# Patient Record
Sex: Female | Born: 1980 | Race: Black or African American | Hispanic: No | Marital: Single | Smoking: Current every day smoker
Health system: Southern US, Community
[De-identification: ages and names within clinical notes are randomized; demographics above are authoritative.]

## PROBLEM LIST (undated history)

## (undated) DIAGNOSIS — B009 Herpesviral infection, unspecified: Secondary | ICD-10-CM

## (undated) DIAGNOSIS — N809 Endometriosis, unspecified: Secondary | ICD-10-CM

## (undated) HISTORY — PX: ABDOMINAL HYSTERECTOMY: SHX81

---

## 2006-02-19 ENCOUNTER — Inpatient Hospital Stay (HOSPITAL_COMMUNITY): Admission: AD | Admit: 2006-02-19 | Discharge: 2006-02-20 | Payer: Self-pay | Admitting: Family Medicine

## 2006-10-31 ENCOUNTER — Inpatient Hospital Stay (HOSPITAL_COMMUNITY): Admission: AD | Admit: 2006-10-31 | Discharge: 2006-10-31 | Payer: Self-pay | Admitting: Family Medicine

## 2006-11-02 ENCOUNTER — Ambulatory Visit (HOSPITAL_COMMUNITY): Admission: RE | Admit: 2006-11-02 | Discharge: 2006-11-02 | Payer: Self-pay | Admitting: Obstetrics and Gynecology

## 2006-12-16 ENCOUNTER — Ambulatory Visit (HOSPITAL_COMMUNITY): Admission: RE | Admit: 2006-12-16 | Discharge: 2006-12-16 | Payer: Self-pay | Admitting: Obstetrics and Gynecology

## 2008-09-18 ENCOUNTER — Emergency Department (HOSPITAL_BASED_OUTPATIENT_CLINIC_OR_DEPARTMENT_OTHER): Admission: EM | Admit: 2008-09-18 | Discharge: 2008-09-18 | Payer: Self-pay | Admitting: Emergency Medicine

## 2009-02-07 ENCOUNTER — Emergency Department (HOSPITAL_BASED_OUTPATIENT_CLINIC_OR_DEPARTMENT_OTHER): Admission: EM | Admit: 2009-02-07 | Discharge: 2009-02-07 | Payer: Self-pay | Admitting: Emergency Medicine

## 2009-09-01 ENCOUNTER — Emergency Department (HOSPITAL_BASED_OUTPATIENT_CLINIC_OR_DEPARTMENT_OTHER): Admission: EM | Admit: 2009-09-01 | Discharge: 2009-09-01 | Payer: Self-pay | Admitting: Emergency Medicine

## 2009-10-08 ENCOUNTER — Observation Stay (HOSPITAL_COMMUNITY): Admission: RE | Admit: 2009-10-08 | Discharge: 2009-10-08 | Payer: Self-pay | Admitting: Obstetrics and Gynecology

## 2010-05-13 ENCOUNTER — Ambulatory Visit (HOSPITAL_COMMUNITY)
Admission: RE | Admit: 2010-05-13 | Discharge: 2010-05-20 | Payer: Self-pay | Source: Home / Self Care | Admitting: Obstetrics and Gynecology

## 2010-06-21 ENCOUNTER — Inpatient Hospital Stay (HOSPITAL_COMMUNITY)
Admission: RE | Admit: 2010-06-21 | Discharge: 2010-06-22 | Payer: Self-pay | Source: Home / Self Care | Attending: Obstetrics and Gynecology | Admitting: Obstetrics and Gynecology

## 2010-06-21 ENCOUNTER — Encounter: Payer: Self-pay | Admitting: Obstetrics and Gynecology

## 2010-07-14 ENCOUNTER — Encounter: Payer: Self-pay | Admitting: Obstetrics and Gynecology

## 2010-08-20 NOTE — Discharge Summary (Signed)
  [name], Natalie Young            ACCOUNT NO.:  [id number]  MEDICAL RECORD NO.:  [id number]          PATIENT TYPE:  INP  LOCATION:  9318                          FACILITY:  [facility]  PHYSICIAN:  Arlyce Harman, MD     DATE OF BIRTH:  02-09-1981  DATE OF ADMISSION:  06/21/2010 DATE OF DISCHARGE:  06/22/2010                              DISCHARGE SUMMARY   REASON FOR HOSPITALIZATION:  The patient is a 30 year old female who had a VBAC at [facility] by Dr. Dion Body.  Following delivery it was noted that there was a laceration in the posterior portion of the cervix, and this was immediately repaired.  However, when the patient presented postpartum, she continued to have heavy bleeding and on exam a persistent defect in the back of the cervix was noted.  Dr. Dion Body discussed the condition with the MFM group from [facility], who recommended hysterectomy.  I discussed this with the patient and explained risks, benefits and possible  complications with the patient.  Informed consent was given for total vaginal hysterectomy. [name] was admitted to the hospital and underwent a vaginal hysterectomy. Postop course was uncomplicated and she was discharged after the first postop day in good condition.  She will be followed up in our office in 2 weeks.  Pertinent lab data none.  SIGNIFICANT FINDINGS:  None.  CONDITION UPON DISCHARGE:  The patient was in excellent condition with normal vital signs, ambulatory, and was voiding well.  INSTRUCTIONS:  The patient to follow up in the office in 2 weeks for temperature of 101 or greater and if any severe abdominal pain or problem.  Pathology report:  benign cervix with mild chronic cervicitis, benign secretory phase endometrium, adenomyosis and serosal fibrous adhesions.  MEDICATIONS:  She is to take Percocet 5 mg one to two every 4-6 hours as needed for pain and Motrin over-the-counter as needed for pain.     Arlyce Harman, MD     EG/MEDQ  D:  08/18/2010  T:  08/19/2010  Job:  [id number]  Electronically Signed by Arlyce Harman MD on 08/20/2010 01:11:55 PM

## 2010-09-02 LAB — CBC
MCH: 22.7 pg — ABNORMAL LOW (ref 26.0–34.0)
MCHC: 32.3 g/dL (ref 30.0–36.0)
MCV: 70.1 fL — ABNORMAL LOW (ref 78.0–100.0)
Platelets: 193 10*3/uL (ref 150–400)
RBC: 5.69 MIL/uL — ABNORMAL HIGH (ref 3.87–5.11)

## 2010-09-02 LAB — SURGICAL PCR SCREEN: Staphylococcus aureus: NEGATIVE

## 2010-09-03 LAB — CBC
HCT: 35.3 % — ABNORMAL LOW (ref 36.0–46.0)
Hemoglobin: 11 g/dL — ABNORMAL LOW (ref 12.0–15.0)
MCH: 24 pg — ABNORMAL LOW (ref 26.0–34.0)
RBC: 4.58 MIL/uL (ref 3.87–5.11)

## 2010-09-03 LAB — SURGICAL PCR SCREEN
MRSA, PCR: NEGATIVE
Staphylococcus aureus: NEGATIVE

## 2010-09-16 LAB — DIFFERENTIAL
Basophils Absolute: 0.2 10*3/uL — ABNORMAL HIGH (ref 0.0–0.1)
Basophils Relative: 2 % — ABNORMAL HIGH (ref 0–1)
Eosinophils Absolute: 0.1 10*3/uL (ref 0.0–0.7)
Eosinophils Relative: 1 % (ref 0–5)
Lymphocytes Relative: 23 % (ref 12–46)
Lymphs Abs: 2.5 10*3/uL (ref 0.7–4.0)
Monocytes Absolute: 0.6 10*3/uL (ref 0.1–1.0)
Monocytes Relative: 6 % (ref 3–12)
Neutro Abs: 7.5 10*3/uL (ref 1.7–7.7)
Neutrophils Relative %: 69 % (ref 43–77)

## 2010-09-16 LAB — BASIC METABOLIC PANEL
BUN: 10 mg/dL (ref 6–23)
CO2: 27 mEq/L (ref 19–32)
Calcium: 9.2 mg/dL (ref 8.4–10.5)
Chloride: 107 mEq/L (ref 96–112)
Creatinine, Ser: 0.8 mg/dL (ref 0.4–1.2)
GFR calc Af Amer: >60 mL/min (ref >60)
GFR calc non Af Amer: >60 mL/min (ref >60)
Glucose, Bld: 101 mg/dL — ABNORMAL HIGH (ref 70–99)
Potassium: 3.7 mEq/L (ref 3.5–5.1)
Sodium: 142 mEq/L (ref 135–145)

## 2010-09-16 LAB — URINALYSIS, ROUTINE W REFLEX MICROSCOPIC
Bilirubin Urine: NEGATIVE
Glucose, UA: NEGATIVE mg/dL
Ketones, ur: NEGATIVE mg/dL
Nitrite: NEGATIVE
Protein, ur: NEGATIVE mg/dL
Specific Gravity, Urine: 1.027 (ref 1.005–1.030)
Urobilinogen, UA: 0.2 mg/dL (ref 0.0–1.0)
pH: 6.5 (ref 5.0–8.0)

## 2010-09-16 LAB — HCG, QUANTITATIVE, PREGNANCY: hCG, Beta Chain, Quant, S: 84423 m[IU]/mL — ABNORMAL HIGH (ref <5)

## 2010-09-16 LAB — WET PREP, GENITAL

## 2010-09-16 LAB — URINE MICROSCOPIC-ADD ON

## 2010-09-16 LAB — PREGNANCY, URINE: Preg Test, Ur: POSITIVE

## 2010-09-16 LAB — CBC
HCT: 36.9 % (ref 36.0–46.0)
Platelets: 223 10*3/uL (ref 150–400)
RDW: 13 % (ref 11.5–15.5)

## 2010-09-16 LAB — GC/CHLAMYDIA PROBE AMP, GENITAL: Chlamydia, DNA Probe: NEGATIVE

## 2010-09-16 LAB — ABO/RH: ABO/RH(D): A POS

## 2010-10-03 LAB — WET PREP, GENITAL
Clue Cells Wet Prep HPF POC: NONE SEEN
Trich, Wet Prep: NONE SEEN

## 2010-10-03 LAB — GC/CHLAMYDIA PROBE AMP, GENITAL: Chlamydia, DNA Probe: NEGATIVE

## 2010-10-03 LAB — URINALYSIS, ROUTINE W REFLEX MICROSCOPIC
Bilirubin Urine: NEGATIVE
Hgb urine dipstick: NEGATIVE
Specific Gravity, Urine: 1.023 (ref 1.005–1.030)
Urobilinogen, UA: 1 mg/dL (ref 0.0–1.0)
pH: 6 (ref 5.0–8.0)

## 2010-10-03 LAB — URINE MICROSCOPIC-ADD ON

## 2011-01-23 ENCOUNTER — Ambulatory Visit (HOSPITAL_COMMUNITY): Payer: Self-pay | Admitting: Physician Assistant

## 2014-07-31 ENCOUNTER — Emergency Department (HOSPITAL_BASED_OUTPATIENT_CLINIC_OR_DEPARTMENT_OTHER)
Admission: EM | Admit: 2014-07-31 | Discharge: 2014-07-31 | Disposition: A | Discharge status: Home / Self Care | Payer: Medicaid Other | Attending: Emergency Medicine | Admitting: Emergency Medicine

## 2014-07-31 ENCOUNTER — Encounter (HOSPITAL_BASED_OUTPATIENT_CLINIC_OR_DEPARTMENT_OTHER): Payer: Self-pay | Admitting: *Deleted

## 2014-07-31 ENCOUNTER — Emergency Department (HOSPITAL_BASED_OUTPATIENT_CLINIC_OR_DEPARTMENT_OTHER): Payer: Medicaid Other

## 2014-07-31 DIAGNOSIS — Z8619 Personal history of other infectious and parasitic diseases: Secondary | ICD-10-CM | POA: Insufficient documentation

## 2014-07-31 DIAGNOSIS — R51 Headache: Secondary | ICD-10-CM | POA: Insufficient documentation

## 2014-07-31 DIAGNOSIS — N760 Acute vaginitis: Secondary | ICD-10-CM | POA: Insufficient documentation

## 2014-07-31 DIAGNOSIS — Z880 Allergy status to penicillin: Secondary | ICD-10-CM | POA: Insufficient documentation

## 2014-07-31 DIAGNOSIS — R112 Nausea with vomiting, unspecified: Secondary | ICD-10-CM | POA: Insufficient documentation

## 2014-07-31 DIAGNOSIS — H538 Other visual disturbances: Secondary | ICD-10-CM | POA: Insufficient documentation

## 2014-07-31 DIAGNOSIS — B9689 Other specified bacterial agents as the cause of diseases classified elsewhere: Secondary | ICD-10-CM

## 2014-07-31 DIAGNOSIS — L03314 Cellulitis of groin: Secondary | ICD-10-CM | POA: Insufficient documentation

## 2014-07-31 DIAGNOSIS — Z720 Tobacco use: Secondary | ICD-10-CM | POA: Insufficient documentation

## 2014-07-31 DIAGNOSIS — R519 Headache, unspecified: Secondary | ICD-10-CM

## 2014-07-31 DIAGNOSIS — Z79899 Other long term (current) drug therapy: Secondary | ICD-10-CM | POA: Insufficient documentation

## 2014-07-31 HISTORY — DX: Herpesviral infection, unspecified: B00.9

## 2014-07-31 HISTORY — DX: Endometriosis, unspecified: N80.9

## 2014-07-31 LAB — WET PREP, GENITAL
Trich, Wet Prep: NONE SEEN
YEAST WET PREP: NONE SEEN

## 2014-07-31 MED ORDER — SULFAMETHOXAZOLE-TRIMETHOPRIM 800-160 MG PO TABS: 1.0000 | ORAL_TABLET | Freq: Two times a day (BID) | ORAL | Status: DC

## 2014-07-31 MED ORDER — DIPHENHYDRAMINE HCL 50 MG/ML IJ SOLN
25.0000 mg | Freq: Once | INTRAMUSCULAR | Status: AC
  Administered 2014-07-31: 25 mg via INTRAVENOUS
  Filled 2014-07-31: qty 1

## 2014-07-31 MED ORDER — DEXAMETHASONE SODIUM PHOSPHATE 10 MG/ML IJ SOLN
10.0000 mg | Freq: Once | INTRAMUSCULAR | Status: AC
  Administered 2014-07-31: 10 mg via INTRAVENOUS
  Filled 2014-07-31: qty 1

## 2014-07-31 MED ORDER — METOCLOPRAMIDE HCL 5 MG/ML IJ SOLN
10.0000 mg | Freq: Once | INTRAMUSCULAR | Status: AC
  Administered 2014-07-31: 10 mg via INTRAVENOUS
  Filled 2014-07-31: qty 2

## 2014-07-31 MED ORDER — METRONIDAZOLE 500 MG PO TABS: 500.0000 mg | ORAL_TABLET | Freq: Two times a day (BID) | ORAL | Status: DC

## 2014-07-31 NOTE — ED Provider Notes (Signed)
CSN: [id number]     Arrival date & time 07/31/14  1441 History  This chart was scribed for Elwin Mocha, MD by Abel Presto, ED Scribe. This patient was seen in room MH08/MH08 and the patient's care was started at 3:19 PM.     Chief Complaint  Patient presents with  . Headache     Patient is a 34 y.o. female presenting with headaches. The history is provided by the patient. No language interpreter was used.  Headache Pain location:  Frontal Quality:  Dull Radiates to:  Does not radiate Duration:  16 weeks Timing:  Intermittent Progression:  Worsening Chronicity:  Recurrent Similar to prior headaches: yes   Relieved by:  NSAIDs Associated symptoms: nausea and vomiting   Associated symptoms: no fever    HPI Comments: Natalie Young is a 34 y.o. female with PMHx of endometriosis and herpes who presents to the Emergency Department complaining of intermittent frontal pressure headache with first onset 4 months ago. Pt states location of headache changes and have worsened to daily now. Pt notes associated visual disturbances, nausea, and vomiting. Pt has used Excedrin, ibuprofen, and BC powder with mild relief. Pt has never been seen in ED for headaches. Pt has not seen a neurologist or had a CT scan.  Pt has an abscess in her vaginal area. She denies associated swelling. She has never had an abscess before. Pt denies fever and chills.  Past Medical History  Diagnosis Date  . Endometriosis   . Herpes    Past Surgical History  Procedure Laterality Date  . Cesarean section    . Abdominal hysterectomy     History reviewed. No pertinent family history. History  Substance Use Topics  . Smoking status: Current Every Day Smoker -- 0.50 packs/day    Types: Cigarettes  . Smokeless tobacco: Not on file  . Alcohol Use: No   OB History    No data available     Review of Systems  Constitutional: Negative for fever and chills.  Eyes: Positive for visual disturbance (seeing spots).   Gastrointestinal: Positive for nausea and vomiting.  Skin:       abscess  Neurological: Positive for headaches.  All other systems reviewed and are negative.     Allergies  Penicillins  Home Medications   Prior to Admission medications   Medication Sig Start Date End Date Taking? Authorizing Provider  valACYclovir (VALTREX) 500 MG tablet Take 500 mg by mouth 2 (two) times daily.   Yes Historical Provider, MD   BP 122/90 mmHg  Pulse 83  Temp(Src) 99 F (37.2 C) (Oral)  Resp 18  Ht 5' 3.5" (1.613 m)  Wt 165 lb (74.844 kg)  BMI 28.77 kg/m2  SpO2 100% Physical Exam  Constitutional: She is oriented to person, place, and time. She appears well-developed and well-nourished. No distress.  HENT:  Head: Normocephalic and atraumatic.  Eyes: EOM are normal.  Neck: Normal range of motion.  Cardiovascular: Normal rate, regular rhythm and normal heart sounds.   Pulmonary/Chest: Effort normal and breath sounds normal.  Abdominal: Soft. She exhibits no distension. There is no tenderness.  Genitourinary:    Cervix exhibits discharge (white). Cervix exhibits no motion tenderness.  No evidence of Bartholin's  Musculoskeletal: Normal range of motion.  Neurological: She is alert and oriented to person, place, and time. No cranial nerve deficit or sensory deficit. She exhibits normal muscle tone. Coordination normal. GCS eye subscore is 4. GCS verbal subscore is 5. GCS motor subscore  is 6.  Skin: Skin is warm and dry. No rash noted.  Psychiatric: She has a normal mood and affect. Judgment normal.  Nursing note and vitals reviewed.   ED Course  Procedures (including critical care time) DIAGNOSTIC STUDIES: Oxygen Saturation is 100% on room air, normal by my interpretation.    COORDINATION OF CARE: 3:24 PM Discussed treatment plan with patient at beside, the patient agrees with the plan and has no further questions at this time.   Labs Review Labs Reviewed  WET PREP, GENITAL -  Abnormal; Notable for the following:    Clue Cells Wet Prep HPF POC MANY (*)    WBC, Wet Prep HPF POC MODERATE (*)    All other components within normal limits  GC/CHLAMYDIA PROBE AMP ([place])    Imaging Review Ct Head Wo Contrast  07/31/2014   CLINICAL DATA:  Frontal headaches for 4 months, recently worsening  EXAM: CT HEAD WITHOUT CONTRAST  TECHNIQUE: Contiguous axial images were obtained from the base of the skull through the vertex without intravenous contrast.  COMPARISON:  None.  FINDINGS: Skull and Sinuses:Negative for fracture or destructive process. The mastoids, middle ears, and imaged paranasal sinuses are clear.  Orbits: No acute abnormality.  Brain: No evidence of acute infarction, hemorrhage, hydrocephalus, or mass lesion/mass effect.  IMPRESSION: Normal head CT.   Electronically Signed   By: Tiburcio PeaJonathan  Watts M.D.   On: 07/31/2014 15:58     EKG Interpretation None      MDM   Final diagnoses:  Acute nonintractable headache, unspecified headache type  BV (bacterial vaginosis)  Cellulitis of groin   34 year old female here with 2 complaints, headache and vaginal cellulitis. Headache: Patient has had intermittent headaches for the past 4 months. Frontal, radiating to the back of her head. Usually has relief with over-the-counter medicines including Excedrin Migraine. Headaches continue to worsen however and are not going away. She has not seen a neurologist nor has she had neuro imaging. She is neurologically intact here. Only other systemic symptoms include nausea vomiting when the headache gets severe. No fevers or blurred vision. No numbness or tingling. We'll give headache cocktail and obtain head CT.  Also having 24 hours of a possible vaginal abscess. Right lower labia with induration without fluctuance, erythema, tenderness. Pelvic exam shows small indurated area without erythema lateral to R labia. No evidence of Bartholin's. It not a superficial abscess, feels more  like cellulitis. Will treat with antibiotics - she is getting flagyl for BV, so will add keflex to coverage for GU flora. Patient feeling better after headache cocktail. Wet prep shows many clue cells. Will give flagyl also. Stable for discharge.    I personally performed the services described in this documentation, which was scribed in my presence. The recorded information has been reviewed and is accurate.Elwin Mocha.       Natalie Slovacek, MD 07/31/14 1640

## 2014-07-31 NOTE — ED Notes (Signed)
Pt c/o h/a x 4 months and abscess to vaginal area x 24 hr

## 2014-07-31 NOTE — Discharge Instructions (Signed)
Bacterial Vaginosis Bacterial vaginosis is a vaginal infection that occurs when the normal balance of bacteria in the vagina is disrupted. It results from an overgrowth of certain bacteria. This is the most common vaginal infection in women of childbearing age. Treatment is important to prevent complications, especially in pregnant women, as it can cause a premature delivery. CAUSES  Bacterial vaginosis is caused by an increase in harmful bacteria that are normally present in smaller amounts in the vagina. Several different kinds of bacteria can cause bacterial vaginosis. However, the reason that the condition develops is not fully understood. RISK FACTORS Certain activities or behaviors can put you at an increased risk of developing bacterial vaginosis, including:  Having a new sex partner or multiple sex partners.  Douching.  Using an intrauterine device (IUD) for contraception. Women do not get bacterial vaginosis from toilet seats, bedding, swimming pools, or contact with objects around them. SIGNS AND SYMPTOMS  Some women with bacterial vaginosis have no signs or symptoms. Common symptoms include:  Grey vaginal discharge.  A fishlike odor with discharge, especially after sexual intercourse.  Itching or burning of the vagina and vulva.  Burning or pain with urination. DIAGNOSIS  Your health care provider will take a medical history and examine the vagina for signs of bacterial vaginosis. A sample of vaginal fluid may be taken. Your health care provider will look at this sample under a microscope to check for bacteria and abnormal cells. A vaginal pH test may also be done.  TREATMENT  Bacterial vaginosis may be treated with antibiotic medicines. These may be given in the form of a pill or a vaginal cream. A second round of antibiotics may be prescribed if the condition comes back after treatment.  HOME CARE INSTRUCTIONS   Only take over-the-counter or prescription medicines as  directed by your health care provider.  If antibiotic medicine was prescribed, take it as directed. Make sure you finish it even if you start to feel better.  Do not have sex until treatment is completed.  Tell all sexual partners that you have a vaginal infection. They should see their health care provider and be treated if they have problems, such as a mild rash or itching.  Practice safe sex by using condoms and only having one sex partner. SEEK MEDICAL CARE IF:   Your symptoms are not improving after 3 days of treatment.  You have increased discharge or pain.  You have a fever. MAKE SURE YOU:   Understand these instructions.  Will watch your condition.  Will get help right away if you are not doing well or get worse. FOR MORE INFORMATION  Centers for Disease Control and Prevention, Division of STD Prevention: [employer]raiser[employer]raud.fi American Sexual Health Association (ASHA): www.ashastd.org  Document Released: 06/09/2005 Document Revised: 03/30/2013 Document Reviewed: 01/19/2013 [facility] Patient Information 2015 [place], [place]. This information is not intended to replace advice given to you by your health care provider. Make sure you discuss any questions you have with your health care provider.  Cellulitis Cellulitis is an infection of the skin and the tissue beneath it. The infected area is usually red and tender. Cellulitis occurs most often in the arms and lower legs.  CAUSES  Cellulitis is caused by bacteria that enter the skin through cracks or cuts in the skin. The most common types of bacteria that cause cellulitis are staphylococci and streptococci. SIGNS AND SYMPTOMS   Redness and warmth.  Swelling.  Tenderness or pain.  Fever. DIAGNOSIS  Your health care  provider can usually determine what is wrong based on a physical exam. Blood tests may also be done. TREATMENT  Treatment usually involves taking an antibiotic medicine. HOME CARE INSTRUCTIONS   Take your  antibiotic medicine as directed by your health care provider. Finish the antibiotic even if you start to feel better.  Keep the infected arm or leg elevated to reduce swelling.  Apply a warm cloth to the affected area up to 4 times per day to relieve pain.  Take medicines only as directed by your health care provider.  Keep all follow-up visits as directed by your health care provider. SEEK MEDICAL CARE IF:   You notice red streaks coming from the infected area.  Your red area gets larger or turns dark in color.  Your bone or joint underneath the infected area becomes painful after the skin has healed.  Your infection returns in the same area or another area.  You notice a swollen bump in the infected area.  You develop new symptoms.  You have a fever. SEEK IMMEDIATE MEDICAL CARE IF:   You feel very sleepy.  You develop vomiting or diarrhea.  You have a general ill feeling (malaise) with muscle aches and pains. MAKE SURE YOU:   Understand these instructions.  Will watch your condition.  Will get help right away if you are not doing well or get worse. Document Released: 03/19/2005 Document Revised: 10/24/2013 Document Reviewed: 08/25/2011 [facility] Patient Information 2015 [place]. This information is not intended to replace advice given to you by your health care provider. Make sure you discuss any questions you have with your health care provider.  General Headache Without Cause A headache is pain or discomfort felt around the head or neck area. The specific cause of a headache may not be found. There are many causes and types of headaches. A few common ones are:  Tension headaches.  Migraine headaches.  Cluster headaches.  Chronic daily headaches. HOME CARE INSTRUCTIONS   Keep all follow-up appointments with your caregiver or any specialist referral.  Only take over-the-counter or prescription medicines for pain or discomfort as directed by your  caregiver.  Lie down in a dark, quiet room when you have a headache.  Keep a headache journal to find out what may trigger your migraine headaches. For example, write down:  What you eat and drink.  How much sleep you get.  Any change to your diet or medicines.  Try massage or other relaxation techniques.  Put ice packs or heat on the head and neck. Use these 3 to 4 times per day for 15 to 20 minutes each time, or as needed.  Limit stress.  Sit up straight, and do not tense your muscles.  Quit smoking if you smoke.  Limit alcohol use.  Decrease the amount of caffeine you drink, or stop drinking caffeine.  Eat and sleep on a regular schedule.  Get 7 to 9 hours of sleep, or as recommended by your caregiver.  Keep lights dim if bright lights bother you and make your headaches worse. SEEK MEDICAL CARE IF:   You have problems with the medicines you were prescribed.  Your medicines are not working.  You have a change from the usual headache.  You have nausea or vomiting. SEEK IMMEDIATE MEDICAL CARE IF:   Your headache becomes severe.  You have a fever.  You have a stiff neck.  You have loss of vision.  You have muscular weakness or loss of muscle control.  You start losing your balance or have trouble walking.  You feel faint or pass out.  You have severe symptoms that are different from your first symptoms. MAKE SURE YOU:   Understand these instructions.  Will watch your condition.  Will get help right away if you are not doing well or get worse. Document Released: 06/09/2005 Document Revised: 09/01/2011 Document Reviewed: 06/25/2011 [facility] Patient Information 2015 [place], [place]. This information is not intended to replace advice given to you by your health care provider. Make sure you discuss any questions you have with your health care provider.

## 2014-08-01 LAB — GC/CHLAMYDIA PROBE AMP (~~LOC~~) NOT AT ARMC
Chlamydia: NEGATIVE
NEISSERIA GONORRHEA: NEGATIVE

## 2014-09-15 ENCOUNTER — Emergency Department (HOSPITAL_BASED_OUTPATIENT_CLINIC_OR_DEPARTMENT_OTHER)
Admission: EM | Admit: 2014-09-15 | Discharge: 2014-09-15 | Disposition: A | Discharge status: Home / Self Care | Payer: Medicaid Other | Attending: Emergency Medicine | Admitting: Emergency Medicine

## 2014-09-15 ENCOUNTER — Encounter (HOSPITAL_BASED_OUTPATIENT_CLINIC_OR_DEPARTMENT_OTHER): Payer: Self-pay | Admitting: *Deleted

## 2014-09-15 DIAGNOSIS — R5383 Other fatigue: Secondary | ICD-10-CM | POA: Insufficient documentation

## 2014-09-15 DIAGNOSIS — R1110 Vomiting, unspecified: Secondary | ICD-10-CM | POA: Insufficient documentation

## 2014-09-15 DIAGNOSIS — Z8619 Personal history of other infectious and parasitic diseases: Secondary | ICD-10-CM | POA: Insufficient documentation

## 2014-09-15 DIAGNOSIS — M791 Myalgia: Secondary | ICD-10-CM | POA: Insufficient documentation

## 2014-09-15 DIAGNOSIS — Z880 Allergy status to penicillin: Secondary | ICD-10-CM | POA: Insufficient documentation

## 2014-09-15 DIAGNOSIS — J111 Influenza due to unidentified influenza virus with other respiratory manifestations: Secondary | ICD-10-CM

## 2014-09-15 DIAGNOSIS — Z79899 Other long term (current) drug therapy: Secondary | ICD-10-CM | POA: Insufficient documentation

## 2014-09-15 DIAGNOSIS — R509 Fever, unspecified: Secondary | ICD-10-CM | POA: Insufficient documentation

## 2014-09-15 DIAGNOSIS — Z792 Long term (current) use of antibiotics: Secondary | ICD-10-CM | POA: Insufficient documentation

## 2014-09-15 DIAGNOSIS — R69 Illness, unspecified: Secondary | ICD-10-CM

## 2014-09-15 DIAGNOSIS — Z87448 Personal history of other diseases of urinary system: Secondary | ICD-10-CM | POA: Insufficient documentation

## 2014-09-15 MED ORDER — HYDROCODONE-HOMATROPINE 5-1.5 MG/5ML PO SYRP: 5.0000 mL | ORAL_SOLUTION | Freq: Four times a day (QID) | ORAL | Status: DC | PRN

## 2014-09-15 NOTE — ED Provider Notes (Signed)
CSN: [id number]     Arrival date & time 09/15/14  1135 History   First MD Initiated Contact with Patient 09/15/14 1222     Chief Complaint  Patient presents with  . Emesis  . Fever     (Consider location/radiation/quality/duration/timing/severity/associated sxs/prior Treatment) HPI Comments: Patient presents with a 4 to five-day history of myalgias runny nose congestion and coughing. She's also had some intermittent fevers. She's had some posttussive emesis and some loose stools. She denies any shortness of breath. She has pain to the right side of her neck but denies any midline neck pain or stiffness. She's had some intermittent headaches but no worsening headaches. She's been using over-the-counter medicines without relief.  Patient is a 34 y.o. female presenting with vomiting and fever.  Emesis Associated symptoms: myalgias   Associated symptoms: no abdominal pain, no arthralgias, no chills, no diarrhea and no headaches   Fever Associated symptoms: congestion, cough, myalgias, rhinorrhea and vomiting   Associated symptoms: no chest pain, no chills, no diarrhea, no headaches, no nausea and no rash     Past Medical History  Diagnosis Date  . Endometriosis   . Herpes    Past Surgical History  Procedure Laterality Date  . Cesarean section    . Abdominal hysterectomy     No family history on file. History  Substance Use Topics  . Smoking status: Current Every Day Smoker -- 0.50 packs/day    Types: Cigarettes  . Smokeless tobacco: Not on file  . Alcohol Use: No   OB History    No data available     Review of Systems  Constitutional: Positive for fever and fatigue. Negative for chills and diaphoresis.  HENT: Positive for congestion, postnasal drip and rhinorrhea. Negative for sneezing.   Eyes: Negative.   Respiratory: Positive for cough. Negative for chest tightness and shortness of breath.   Cardiovascular: Negative for chest pain and leg swelling.  Gastrointestinal:  Positive for vomiting. Negative for nausea, abdominal pain, diarrhea and blood in stool.  Genitourinary: Negative for frequency, hematuria, flank pain and difficulty urinating.  Musculoskeletal: Positive for myalgias. Negative for back pain and arthralgias.  Skin: Negative for rash.  Neurological: Negative for dizziness, speech difficulty, weakness, numbness and headaches.      Allergies  Penicillins  Home Medications   Prior to Admission medications   Medication Sig Start Date End Date Taking? Authorizing Provider  valACYclovir (VALTREX) 500 MG tablet Take 500 mg by mouth 2 (two) times daily.   Yes Historical Provider, MD  HYDROcodone-homatropine (HYCODAN) 5-1.5 MG/5ML syrup Take 5 mLs by mouth every 6 (six) hours as needed for cough. 09/15/14   Rolan Bucco, MD  metroNIDAZOLE (FLAGYL) 500 MG tablet Take 1 tablet (500 mg total) by mouth 2 (two) times daily. 07/31/14   Elwin Mocha, MD  sulfamethoxazole-trimethoprim (SEPTRA DS) 800-160 MG per tablet Take 1 tablet by mouth every 12 (twelve) hours. 07/31/14   Elwin Mocha, MD   BP 116/83 mmHg  Pulse 86  Temp(Src) 98.7 F (37.1 C) (Oral)  Resp 20  Ht  (1.626 m)  Wt 180 lb (81.647 kg)  BMI 30.88 kg/m2  SpO2 100% Physical Exam  Constitutional: She is oriented to person, place, and time. She appears well-developed and well-nourished.  HENT:  Head: Normocephalic and atraumatic.  Mouth/Throat: Oropharynx is clear and moist.  Eyes: Conjunctivae are normal. Pupils are equal, round, and reactive to light.  Neck: Normal range of motion. Neck supple.  Cardiovascular: Normal rate, regular rhythm  and normal heart sounds.   Pulmonary/Chest: Effort normal and breath sounds normal. No respiratory distress. She has no wheezes. She has no rales. She exhibits no tenderness.  Abdominal: Soft. Bowel sounds are normal. There is no tenderness. There is no rebound and no guarding.  Musculoskeletal: Normal range of motion. She exhibits no edema.   Lymphadenopathy:    She has no cervical adenopathy.  Neurological: She is alert and oriented to person, place, and time.  Skin: Skin is warm and dry. No rash noted.  Psychiatric: She has a normal mood and affect.    ED Course  Procedures (including critical care time) Labs Review Labs Reviewed - No data to display  Imaging Review No results found.   EKG Interpretation None      MDM   Final diagnoses:  Influenza-like illness    Patient presents with influenza-like illness for the last or days. She's out of the window for Tamiflu. She is well-appearing with no active vomiting. Her vomiting seems mostly posttussive. Her vital signs are stable. She has no suggestions of meningitis or pneumonia. I offered her IV fluids but she doesn't feel that she needs them. I advised her in symptomatic care. She was given a prescription for Hycodan cough syrup. She was advised to return if she has any worsening symptoms or signs of pneumonia which were explained to the patient.    Rolan BuccoMelanie Takhia Spoon, MD 09/15/14 1318

## 2014-09-15 NOTE — ED Notes (Signed)
Pt reports body aches, fever, and n/v/d since yesterday

## 2014-09-15 NOTE — ED Notes (Signed)
Pt states she is taking theraflu, vitamin c and drinking powerade with no relief. Pt states her temp last night was 100.7

## 2015-03-09 ENCOUNTER — Emergency Department (HOSPITAL_BASED_OUTPATIENT_CLINIC_OR_DEPARTMENT_OTHER)
Admission: EM | Admit: 2015-03-09 | Discharge: 2015-03-09 | Disposition: A | Discharge status: Home / Self Care | Payer: No Typology Code available for payment source | Attending: Emergency Medicine | Admitting: Emergency Medicine

## 2015-03-09 ENCOUNTER — Encounter (HOSPITAL_BASED_OUTPATIENT_CLINIC_OR_DEPARTMENT_OTHER): Payer: Self-pay | Admitting: *Deleted

## 2015-03-09 DIAGNOSIS — Z720 Tobacco use: Secondary | ICD-10-CM | POA: Insufficient documentation

## 2015-03-09 DIAGNOSIS — Y9389 Activity, other specified: Secondary | ICD-10-CM | POA: Diagnosis not present

## 2015-03-09 DIAGNOSIS — Y92410 Unspecified street and highway as the place of occurrence of the external cause: Secondary | ICD-10-CM | POA: Diagnosis not present

## 2015-03-09 DIAGNOSIS — Z880 Allergy status to penicillin: Secondary | ICD-10-CM | POA: Insufficient documentation

## 2015-03-09 DIAGNOSIS — S199XXA Unspecified injury of neck, initial encounter: Secondary | ICD-10-CM | POA: Insufficient documentation

## 2015-03-09 DIAGNOSIS — S4992XA Unspecified injury of left shoulder and upper arm, initial encounter: Secondary | ICD-10-CM | POA: Diagnosis not present

## 2015-03-09 DIAGNOSIS — S3992XA Unspecified injury of lower back, initial encounter: Secondary | ICD-10-CM | POA: Insufficient documentation

## 2015-03-09 DIAGNOSIS — Y998 Other external cause status: Secondary | ICD-10-CM | POA: Diagnosis not present

## 2015-03-09 DIAGNOSIS — S8992XA Unspecified injury of left lower leg, initial encounter: Secondary | ICD-10-CM | POA: Insufficient documentation

## 2015-03-09 DIAGNOSIS — Z8742 Personal history of other diseases of the female genital tract: Secondary | ICD-10-CM | POA: Diagnosis not present

## 2015-03-09 DIAGNOSIS — Z79899 Other long term (current) drug therapy: Secondary | ICD-10-CM | POA: Diagnosis not present

## 2015-03-09 DIAGNOSIS — Z8619 Personal history of other infectious and parasitic diseases: Secondary | ICD-10-CM | POA: Diagnosis not present

## 2015-03-09 MED ORDER — METHOCARBAMOL 500 MG PO TABS: 500.0000 mg | ORAL_TABLET | Freq: Two times a day (BID) | ORAL | Status: AC

## 2015-03-09 MED ORDER — IBUPROFEN 400 MG PO TABS: 400.0000 mg | ORAL_TABLET | Freq: Four times a day (QID) | ORAL | Status: AC | PRN
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
## 2016-04-22 ENCOUNTER — Emergency Department (HOSPITAL_BASED_OUTPATIENT_CLINIC_OR_DEPARTMENT_OTHER)
Admission: EM | Admit: 2016-04-22 | Discharge: 2016-04-22 | Disposition: A | Discharge status: Home / Self Care | Payer: Medicaid Other | Attending: Physician Assistant | Admitting: Physician Assistant

## 2016-04-22 ENCOUNTER — Encounter (HOSPITAL_BASED_OUTPATIENT_CLINIC_OR_DEPARTMENT_OTHER): Payer: Self-pay | Admitting: Emergency Medicine

## 2016-04-22 DIAGNOSIS — R42 Dizziness and giddiness: Secondary | ICD-10-CM | POA: Diagnosis present

## 2016-04-22 DIAGNOSIS — Z79899 Other long term (current) drug therapy: Secondary | ICD-10-CM | POA: Diagnosis not present

## 2016-04-22 DIAGNOSIS — F17210 Nicotine dependence, cigarettes, uncomplicated: Secondary | ICD-10-CM | POA: Diagnosis not present

## 2016-04-22 DIAGNOSIS — R55 Syncope and collapse: Secondary | ICD-10-CM | POA: Insufficient documentation

## 2016-04-22 LAB — CBC WITH DIFFERENTIAL/PLATELET
BASOS ABS: 0.1 10*3/uL (ref 0.0–0.1)
Basophils Relative: 1 %
EOS ABS: 0.1 10*3/uL (ref 0.0–0.7)
Eosinophils Relative: 1 %
HCT: 36.8 % (ref 36.0–46.0)
Hemoglobin: 12.1 g/dL (ref 12.0–15.0)
LYMPHS ABS: 3.2 10*3/uL (ref 0.7–4.0)
Lymphocytes Relative: 45 %
MCH: 22.9 pg — ABNORMAL LOW (ref 26.0–34.0)
MCHC: 32.9 g/dL (ref 30.0–36.0)
MCV: 69.6 fL — ABNORMAL LOW (ref 78.0–100.0)
MONO ABS: 0.4 10*3/uL (ref 0.1–1.0)
Monocytes Relative: 6 %
NEUTROS ABS: 3.3 10*3/uL (ref 1.7–7.7)
Neutrophils Relative %: 47 %
Platelets: 256 10*3/uL (ref 150–400)
RBC: 5.29 MIL/uL — ABNORMAL HIGH (ref 3.87–5.11)
RDW: 14 % (ref 11.5–15.5)
WBC: 7.1 10*3/uL (ref 4.0–10.5)

## 2016-04-22 LAB — BASIC METABOLIC PANEL
Anion gap: 7 (ref 5–15)
BUN: 17 mg/dL (ref 6–20)
CALCIUM: 9.3 mg/dL (ref 8.9–10.3)
CO2: 24 mmol/L (ref 22–32)
CREATININE: 0.77 mg/dL (ref 0.44–1.00)
Chloride: 105 mmol/L (ref 101–111)
GFR calc Af Amer: >60 mL/min (ref >60)
GFR calc non Af Amer: >60 mL/min (ref >60)
Glucose, Bld: 131 mg/dL — ABNORMAL HIGH (ref 65–99)
Potassium: 3.8 mmol/L (ref 3.5–5.1)
SODIUM: 136 mmol/L (ref 135–145)

## 2016-04-22 LAB — CBG MONITORING, ED: Glucose-Capillary: 154 mg/dL — ABNORMAL HIGH (ref 65–99)

## 2016-04-22 NOTE — ED Triage Notes (Signed)
Patient reports that she passed out last night while getting ready to take a bath. The patient reports that she has had dizziness since last night and all day today
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
## 2016-04-22 NOTE — ED Provider Notes (Signed)
[facility] Provider Note   CSN: [id number] Arrival date & time: 04/22/16  2216   By signing my name below, I, Nelwyn SalisburyJoshua Fowler, attest that this documentation has been prepared under the direction and in the presence of non-physician practitioner, Audry Piliyler Raoul Ciano, PA-C. Electronically Signed: Nelwyn SalisburyJoshua Fowler, Scribe. 04/22/2016. 10:33 PM.  History   Chief Complaint Chief Complaint  Patient presents with  . Dizziness   HPI  HPI Comments:  Natalie Young is an otherwise healthy 35 y.o. female who presents to the Emergency Department complaining of sudden-onset singular episode of syncope occurring yesterday night. Pt states that she was getting out of the bath and was walking towards her bedroom door when she became dizzy and passed out for 15 minutes. Pt reports that she has gone to work today, but her dizziness and light-headedness has persisted. She also reports that she has had a headache and has slept all day since returning from work. Pt denies any visual changes, chest pain, SOB, or numbness. She also denies any hx of seizures or strokes. No other symptoms noted.   Past Medical History:  Diagnosis Date  . Endometriosis   . Herpes     There are no active problems to display for this patient.   Past Surgical History:  Procedure Laterality Date  . ABDOMINAL HYSTERECTOMY    . CESAREAN SECTION      OB History    No data available       Home Medications    Prior to Admission medications   Medication Sig Start Date End Date Taking? Authorizing Provider  gabapentin (NEURONTIN) 300 MG capsule Take 300 mg by mouth 3 (three) times daily.   Yes Historical Provider, MD  ibuprofen (ADVIL,MOTRIN) 400 MG tablet Take 1 tablet (400 mg total) by mouth every 6 (six) hours as needed. 03/09/15   Marily MemosJason Mesner, MD  methocarbamol (ROBAXIN) 500 MG tablet Take 1 tablet (500 mg total) by mouth 2 (two) times daily. 03/09/15   Marily MemosJason Mesner, MD  valACYclovir (VALTREX) 500 MG tablet Take  500 mg by mouth 2 (two) times daily.    Historical Provider, MD    Family History History reviewed. No pertinent family history.  Social History Social History  Substance Use Topics  . Smoking status: Current Every Day Smoker    Packs/day: 0.50    Types: Cigarettes  . Smokeless tobacco: Never Used  . Alcohol use No     Allergies   Penicillins   Review of Systems Review of Systems 10 Systems reviewed and are negative for acute change except as noted in the HPI.  Physical Exam Updated Vital Signs BP 118/77 (BP Location: Right Arm)   Pulse 80   Temp 98.5 F (36.9 C) (Oral)   Resp 20   Ht 5\' 4"  (1.626 m)   Wt 215 lb 4.8 oz (97.7 kg)   SpO2 98%   BMI 36.96 kg/m   Physical Exam  Constitutional: She is oriented to person, place, and time. Vital signs are normal. She appears well-developed and well-nourished. No distress.  HENT:  Head: Normocephalic and atraumatic.  Right Ear: Hearing normal.  Left Ear: Hearing normal.  Eyes: Conjunctivae and EOM are normal. Pupils are equal, round, and reactive to light.  Neck: Normal range of motion. Neck supple.  Cardiovascular: Normal rate, regular rhythm, normal heart sounds and intact distal pulses.   Pulmonary/Chest: Effort normal and breath sounds normal.  Abdominal: Soft. She exhibits no distension. There is no tenderness.  Musculoskeletal: Normal  range of motion.  Neurological: She is alert and oriented to person, place, and time. She has normal strength. No cranial nerve deficit or sensory deficit.  Cranial Nerves:  II: Pupils equal, round, reactive to light III,IV, VI: ptosis not present, extra-ocular motions intact bilaterally  V,VII: smile symmetric, facial light touch sensation equal VIII: hearing grossly normal bilaterally  IX,X: midline uvula rise  XI: bilateral shoulder shrug equal and strong XII: midline tongue extension  Skin: Skin is warm and dry.  Psychiatric: She has a normal mood and affect. Her speech is  normal and behavior is normal. Thought content normal.  Nursing note and vitals reviewed.  ED Treatments / Results  DIAGNOSTIC STUDIES:  Oxygen Saturation is 98% on RA, normal by my interpretation.    COORDINATION OF CARE:  10:33 PM Discussed treatment plan with pt at bedside which includes EKG and pt agreed to plan.  Labs (all labs ordered are listed, but only abnormal results are displayed) Labs Reviewed  CBG MONITORING, ED - Abnormal; Notable for the following:       Result Value   Glucose-Capillary 154 (*)    All other components within normal limits  CBC WITH DIFFERENTIAL/PLATELET  BASIC METABOLIC PANEL    EKG  EKG Interpretation None      Radiology No results found.  Procedures Procedures (including critical care time)  Medications Ordered in ED Medications - No data to display   Initial Impression / Assessment and Plan / ED Course  I have reviewed the triage vital signs and the nursing notes.  Pertinent labs & imaging results that were available during my care of the patient were reviewed by me and considered in my medical decision making (see chart for details).  Clinical Course   Final Clinical Impressions(s) / ED Diagnoses  I have reviewed and evaluated the relevant laboratory values I have interpreted the relevant EKG. I have reviewed the relevant previous healthcare records. I obtained HPI from historian.  ED Course:  Assessment: Pt is a 35yF who presents s/p syncopal episode while exiting warm bathtub. Noted standing and feeling weak and "passed out." Unsure of duration of LOC. No hx seizures or stroke. On exam, pt in NAD. Nontoxic/nonseptic appearing. VSS. Afebrile. Lungs CTA. Heart RRR. Abdomen nontender soft. CN evaluated and unremarkable. No Pain currently. No CP/SOB. No visual changes. Labs unremarkable. EKG unremarkable. Likely vasovagal response from heat and standing. Plan is to DC home with follow up to PCP. At time of discharge, Patient is in  no acute distress. Vital Signs are stable. Patient is able to ambulate. Patient able to tolerate PO.    Disposition/Plan:  DC Home Additional Verbal discharge instructions given and discussed with patient.  Pt Instructed to f/u with PCP in the next week for evaluation and treatment of symptoms. Return precautions given Pt acknowledges and agrees with plan  Supervising Physician Courteney Randall AnLyn Mackuen, MD   Final diagnoses:  Vaso vagal episode    New Prescriptions New Prescriptions   No medications on file   I personally performed the services described in this documentation, which was scribed in my presence. The recorded information has been reviewed and is accurate.     Audry Piliyler Makyla Bye, PA-C 04/22/16 2334    Courteney Randall AnLyn Mackuen, MD 04/23/16 1505

## 2016-05-26 IMAGING — CT CT HEAD W/O CM
1 series · 16 of 30 positions shown, 20 images · non-contrast
Comparison: None.

CLINICAL DATA: Frontal headaches for 4 months, recently worsening

EXAM:
CT HEAD WITHOUT CONTRAST
TECHNIQUE: Contiguous axial images were obtained from the base of the skull
through the vertex without intravenous contrast.

[Series 2: head 4.8 h37s · axial · 0.45mm/px · z∈[-209,-76]mm · 16 of 32 slices shown, 20 images]
[im 2/32  brain]
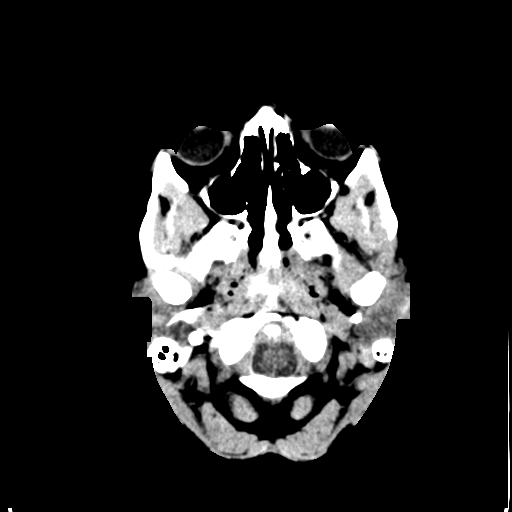
[im 2/32  bone]
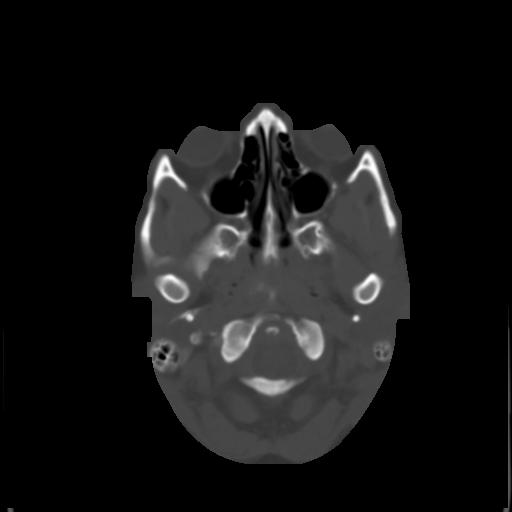
[im 4/32  brain]
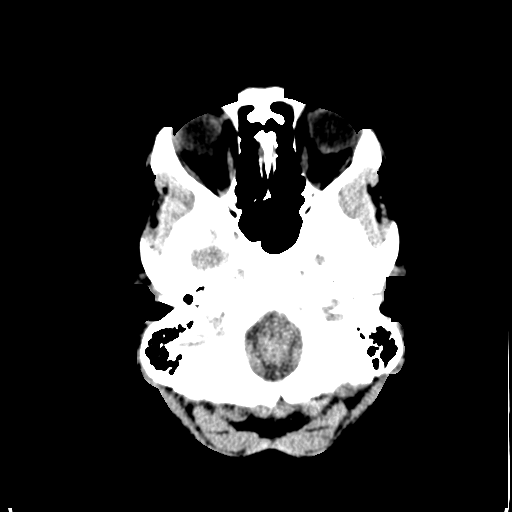
[im 6/32  brain]
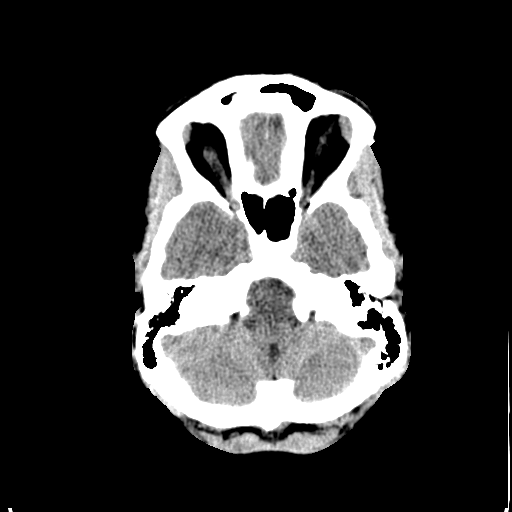
[im 8/32  brain]
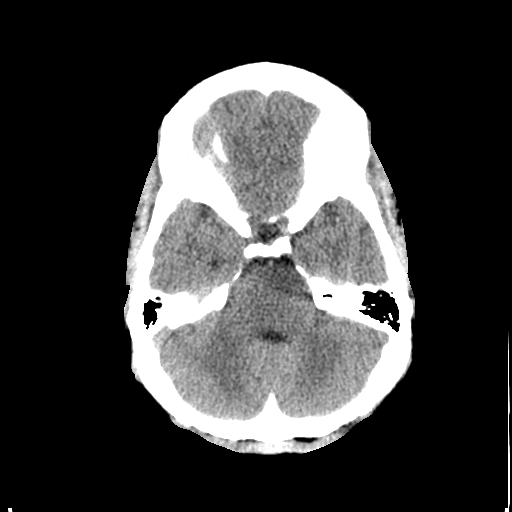
[im 9/32  brain]
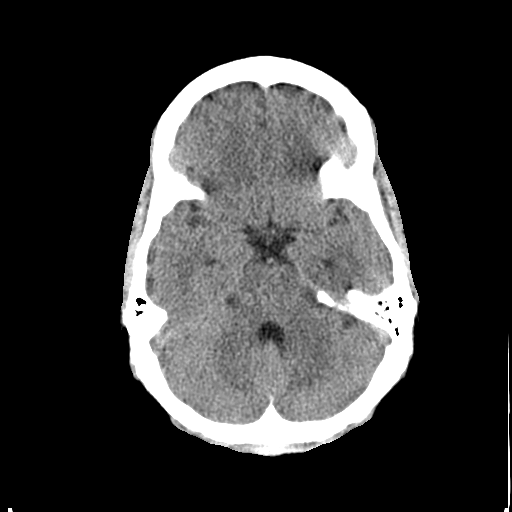
[im 9/32  bone]
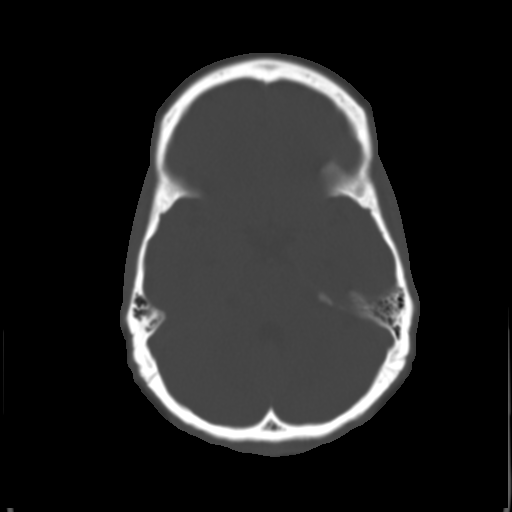
[im 11/32  brain]
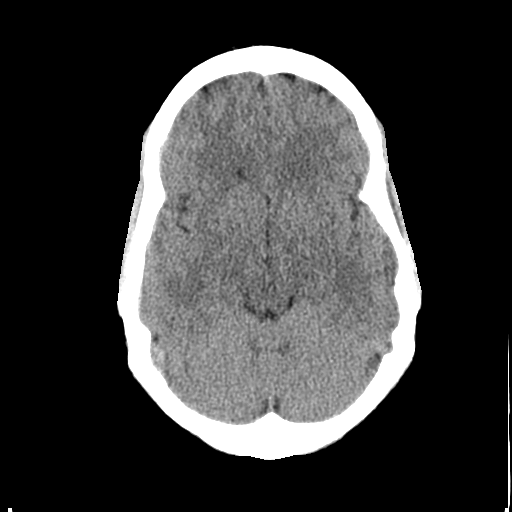
[im 13/32  brain]
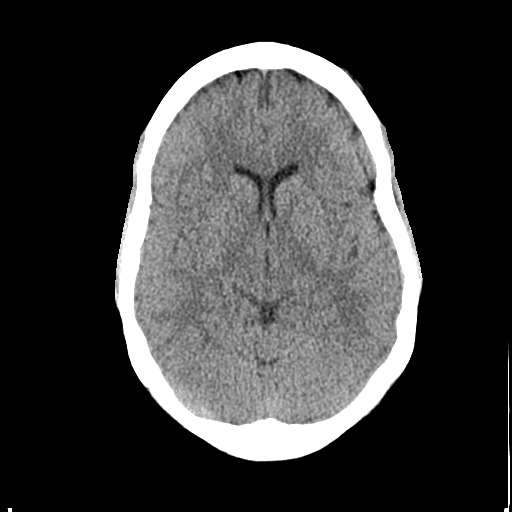
[im 15/32  brain]
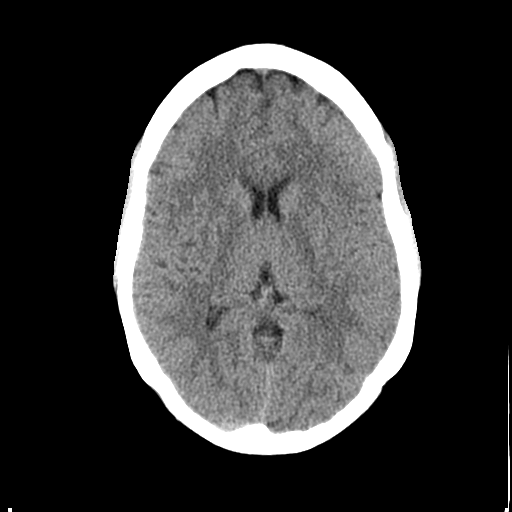
[im 17/32  brain]
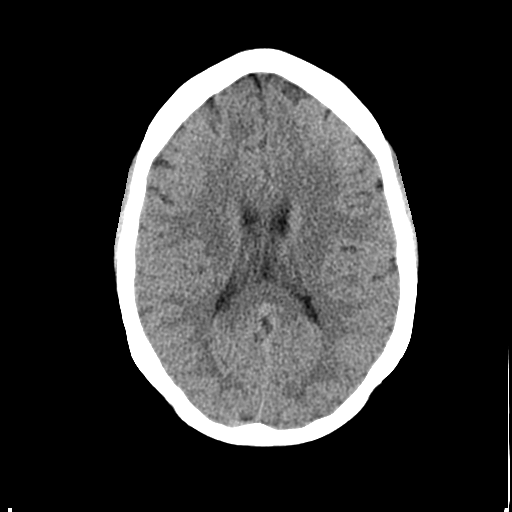
[im 17/32  bone]
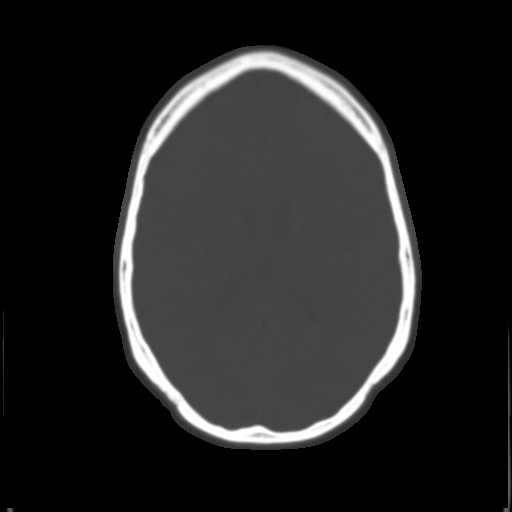
[im 19/32  brain]
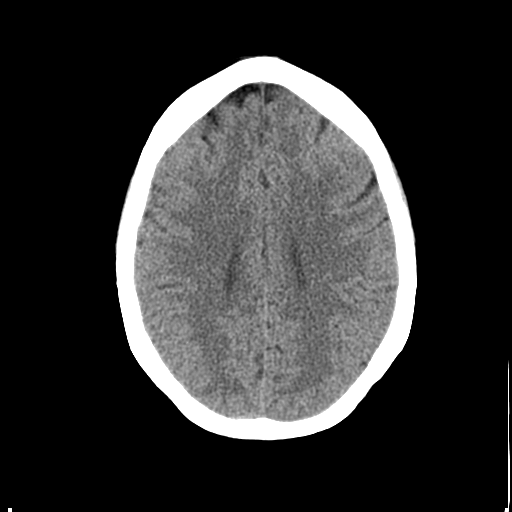
[im 21/32  brain]
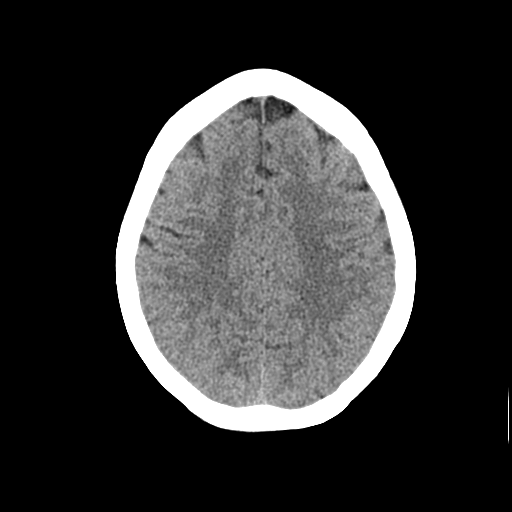
[im 23/32  brain]
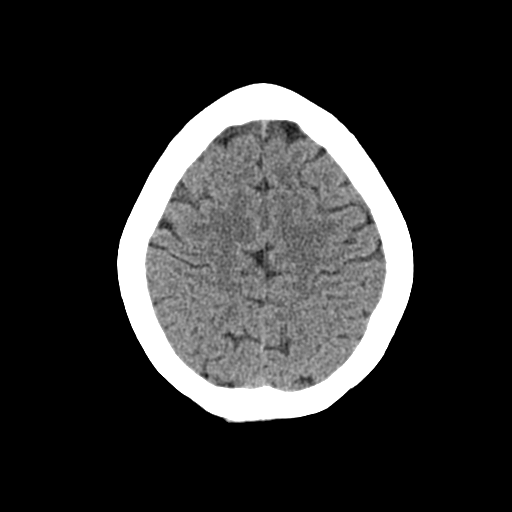
[im 24/32  brain]
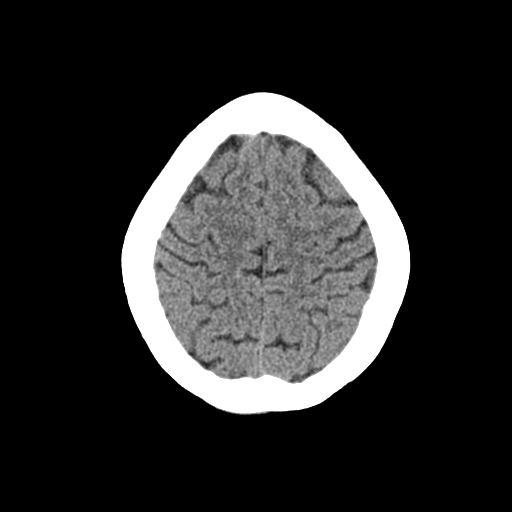
[im 24/32  bone]
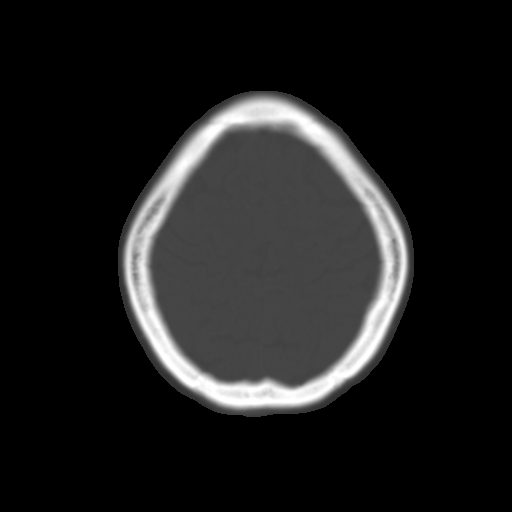
[im 26/32  brain]
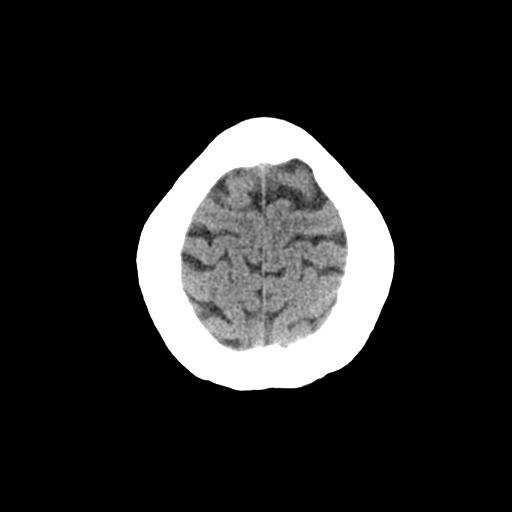
[im 28/32  brain]
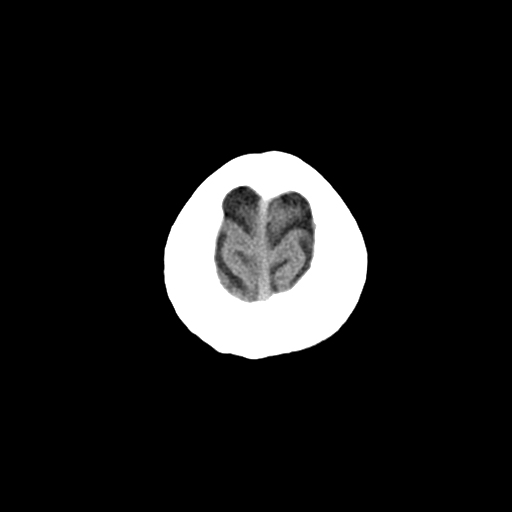
[im 30/32  brain]
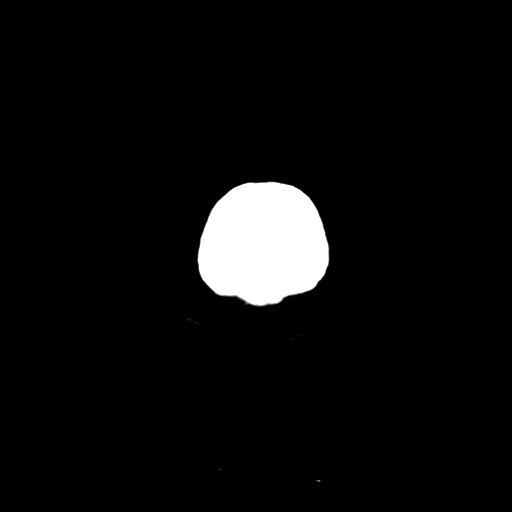

[16 of 30 positions shown; findings below may reference images not displayed]

FINDINGS: Skull and Sinuses:Negative for fracture or destructive process. The
mastoids, middle ears, and imaged paranasal sinuses are clear.

Orbits: No acute abnormality.

Brain: No evidence of acute infarction, hemorrhage, hydrocephalus,
or mass lesion/mass effect.
IMPRESSION: Normal head CT.

## 2019-07-19 ENCOUNTER — Ambulatory Visit: Payer: Medicaid Other | Attending: Internal Medicine

## 2020-07-03 ENCOUNTER — Encounter (HOSPITAL_BASED_OUTPATIENT_CLINIC_OR_DEPARTMENT_OTHER): Payer: Self-pay | Admitting: *Deleted

## 2020-07-03 ENCOUNTER — Emergency Department (HOSPITAL_BASED_OUTPATIENT_CLINIC_OR_DEPARTMENT_OTHER)
Admission: EM | Admit: 2020-07-03 | Discharge: 2020-07-03 | Disposition: A | Discharge status: Home / Self Care | Payer: Medicaid Other | Attending: Emergency Medicine | Admitting: Emergency Medicine

## 2020-07-03 ENCOUNTER — Other Ambulatory Visit: Payer: Self-pay

## 2020-07-03 DIAGNOSIS — H9201 Otalgia, right ear: Secondary | ICD-10-CM | POA: Insufficient documentation

## 2020-07-03 DIAGNOSIS — J069 Acute upper respiratory infection, unspecified: Secondary | ICD-10-CM | POA: Insufficient documentation

## 2020-07-03 DIAGNOSIS — F17210 Nicotine dependence, cigarettes, uncomplicated: Secondary | ICD-10-CM | POA: Insufficient documentation

## 2020-07-03 DIAGNOSIS — R300 Dysuria: Secondary | ICD-10-CM | POA: Insufficient documentation

## 2020-07-03 LAB — URINALYSIS, ROUTINE W REFLEX MICROSCOPIC
Bilirubin Urine: NEGATIVE
Glucose, UA: NEGATIVE mg/dL
Hgb urine dipstick: NEGATIVE
Ketones, ur: NEGATIVE mg/dL
Leukocytes,Ua: NEGATIVE
Nitrite: NEGATIVE
Protein, ur: NEGATIVE mg/dL
Specific Gravity, Urine: 1.025 (ref 1.005–1.030)
pH: 6.5 (ref 5.0–8.0)

## 2020-07-03 NOTE — ED Provider Notes (Signed)
[facility] EMERGENCY DEPARTMENT Provider Note   CSN: [id number] Arrival date & time: 07/03/20  1236     History Chief Complaint  Patient presents with  . Headache    Natalie Young is a 40 y.o. female presenting to the emergency department with complaining of headache, right ear pain, and dysuria that began yesterday.  Patient thinks she has a sinus infection or an ear infection.  She is also concerned about a UTI.  She suprapubic abdominal discomfort with dysuria and urinary frequency.  She has no pelvic pain or discharge.  She has no fevers or or sore throat.  Some runny nose.  No known COVID exposures.  Not vaccinated against COVID.  She states she was tested for COVID at the [facility] today and did not have the result back yet.  The history is provided by the patient.       Past Medical History:  Diagnosis Date  . Endometriosis   . Herpes     There are no problems to display for this patient.   Past Surgical History:  Procedure Laterality Date  . ABDOMINAL HYSTERECTOMY    . CESAREAN SECTION       OB History   No obstetric history on file.     No family history on file.  Social History   Tobacco Use  . Smoking status: Current Every Day Smoker    Packs/day: 0.50    Types: Cigarettes  . Smokeless tobacco: Never Used  Substance Use Topics  . Alcohol use: No  . Drug use: Yes    Types: Marijuana    Home Medications Prior to Admission medications   Medication Sig Start Date End Date Taking? Authorizing Provider  gabapentin (NEURONTIN) 300 MG capsule Take 300 mg by mouth 3 (three) times daily.    [provider]  ibuprofen (ADVIL,MOTRIN) 400 MG tablet Take 1 tablet (400 mg total) by mouth every 6 (six) hours as needed. 03/09/15   Mesner, Barbara Cower, MD  methocarbamol (ROBAXIN) 500 MG tablet Take 1 tablet (500 mg total) by mouth 2 (two) times daily. 03/09/15   Mesner, Barbara Cower, MD  valACYclovir (VALTREX) 500 MG tablet Take 500 mg by mouth  2 (two) times daily.    [provider]    Allergies    Penicillins  Review of Systems   Review of Systems  All other systems reviewed and are negative.   Physical Exam Updated Vital Signs BP (!) 153/84   Pulse 62   Temp 98.2 F (36.8 C) (Oral)   Resp 16   Ht 5\' 4"  (1.626 m)   Wt 88.5 kg   SpO2 100%   BMI 33.51 kg/m   Physical Exam Vitals and nursing note reviewed.  Constitutional:      General: She is not in acute distress.    Appearance: She is well-developed and well-nourished. She is not ill-appearing.  HENT:     Head: Normocephalic and atraumatic.     Right Ear: Tympanic membrane and ear canal normal.     Left Ear: Tympanic membrane and ear canal normal.     Mouth/Throat:     Pharynx: Uvula midline. Posterior oropharyngeal erythema present. No pharyngeal swelling or oropharyngeal exudate.  Eyes:     Conjunctiva/sclera: Conjunctivae normal.  Cardiovascular:     Rate and Rhythm: Normal rate and regular rhythm.  Pulmonary:     Effort: Pulmonary effort is normal. No respiratory distress.     Breath sounds: Normal breath sounds.  Abdominal:  General: Bowel sounds are normal.     Palpations: Abdomen is soft.     Tenderness: There is no abdominal tenderness. There is no guarding or rebound.  Skin:    General: Skin is warm.  Neurological:     Mental Status: She is alert.  Psychiatric:        Mood and Affect: Mood and affect normal.        Behavior: Behavior normal.     ED Results / Procedures / Treatments   Labs (all labs ordered are listed, but only abnormal results are displayed) Labs Reviewed  URINE CULTURE  URINALYSIS, ROUTINE W REFLEX MICROSCOPIC    EKG None  Radiology No results found.  Procedures Procedures (including critical care time)  Medications Ordered in ED Medications - No data to display  ED Course  I have reviewed the triage vital signs and the nursing notes.  Pertinent labs & imaging results that were available  during my care of the patient were reviewed by me and considered in my medical decision making (see chart for details).    MDM Rules/Calculators/A&P                         Natalie Young was evaluated in Emergency Department on 07/03/2020 for the symptoms described in the history of present illness. She was evaluated in the context of the global COVID-19 pandemic, which necessitated consideration that the patient might be at risk for infection with the SARS-CoV-2 virus that causes COVID-19. Institutional protocols and algorithms that pertain to the evaluation of patients at risk for COVID-19 are in a state of rapid change based on information released by regulatory bodies including the CDC and federal and state organizations. These policies and algorithms were followed during the patient's care in the ED.  Patient presenting with right ear pain, runny nose, headache, and urinary symptoms began yesterday.  No known exposures.  Not vaccinated against COVID examination is reassuring and benign.  Likely viral in nature.  Vital signs are stable, afebrile.  UA is negative for infection.  She had COVID test pending today.  Recommend she isolate at home while awaiting result.  Symptomatic management.  PCP follow-up if dysuria persists.  Patient is appropriate for discharge.  Discussed results, findings, treatment and follow up. Patient advised of return precautions. Patient verbalized understanding and agreed with plan.  Final Clinical Impression(s) / ED Diagnoses Final diagnoses:  Viral URI  Dysuria    Rx / DC Orders ED Discharge Orders    None       Antavius Sperbeck, [place] N, PA-C 07/03/20 1613    Tegeler, Canary Brim, MD 07/04/20 [phone]

## 2020-07-03 NOTE — ED Triage Notes (Signed)
Headache since yesterday. She thinks she is getting a URI.

## 2020-07-03 NOTE — Discharge Instructions (Addendum)
Your urine test is normal. You can take tylenol or ibuprofen as needed for sore throat or fever.  Drink plenty of water.  Use saline nasal spray for congestion. Follow up with your primary care provider as needed.  Return to the ER for inability to swallow liquids, difficulty breathing, or new or worsening symptoms.

## 2020-07-05 LAB — URINE CULTURE

## 2020-11-16 ENCOUNTER — Other Ambulatory Visit: Payer: Self-pay

## 2020-11-16 ENCOUNTER — Emergency Department (HOSPITAL_BASED_OUTPATIENT_CLINIC_OR_DEPARTMENT_OTHER): Payer: Self-pay

## 2020-11-16 ENCOUNTER — Encounter (HOSPITAL_BASED_OUTPATIENT_CLINIC_OR_DEPARTMENT_OTHER): Payer: Self-pay | Admitting: Emergency Medicine

## 2020-11-16 ENCOUNTER — Emergency Department (HOSPITAL_BASED_OUTPATIENT_CLINIC_OR_DEPARTMENT_OTHER)
Admission: EM | Admit: 2020-11-16 | Discharge: 2020-11-16 | Discharge status: Against Medical Advice | Payer: Self-pay | Attending: Emergency Medicine | Admitting: Emergency Medicine

## 2020-11-16 DIAGNOSIS — Z20822 Contact with and (suspected) exposure to covid-19: Secondary | ICD-10-CM | POA: Insufficient documentation

## 2020-11-16 DIAGNOSIS — G459 Transient cerebral ischemic attack, unspecified: Secondary | ICD-10-CM | POA: Diagnosis present

## 2020-11-16 DIAGNOSIS — R079 Chest pain, unspecified: Secondary | ICD-10-CM

## 2020-11-16 DIAGNOSIS — R0789 Other chest pain: Secondary | ICD-10-CM | POA: Insufficient documentation

## 2020-11-16 DIAGNOSIS — Z8673 Personal history of transient ischemic attack (TIA), and cerebral infarction without residual deficits: Secondary | ICD-10-CM | POA: Insufficient documentation

## 2020-11-16 DIAGNOSIS — F17210 Nicotine dependence, cigarettes, uncomplicated: Secondary | ICD-10-CM | POA: Insufficient documentation

## 2020-11-16 DIAGNOSIS — R200 Anesthesia of skin: Secondary | ICD-10-CM

## 2020-11-16 DIAGNOSIS — R2990 Unspecified symptoms and signs involving the nervous system: Secondary | ICD-10-CM

## 2020-11-16 DIAGNOSIS — N83202 Unspecified ovarian cyst, left side: Secondary | ICD-10-CM | POA: Insufficient documentation

## 2020-11-16 DIAGNOSIS — R202 Paresthesia of skin: Secondary | ICD-10-CM | POA: Insufficient documentation

## 2020-11-16 LAB — ETHANOL: Alcohol, Ethyl (B): 68 mg/dL — ABNORMAL HIGH (ref <10)

## 2020-11-16 LAB — COMPREHENSIVE METABOLIC PANEL
ALT: 14 U/L (ref 0–44)
AST: 21 U/L (ref 15–41)
Albumin: 3.9 g/dL (ref 3.5–5.0)
Alkaline Phosphatase: 45 U/L (ref 38–126)
Anion gap: 8 (ref 5–15)
BUN: 13 mg/dL (ref 6–20)
CO2: 21 mmol/L — ABNORMAL LOW (ref 22–32)
Calcium: 9 mg/dL (ref 8.9–10.3)
Chloride: 108 mmol/L (ref 98–111)
Creatinine, Ser: 0.68 mg/dL (ref 0.44–1.00)
GFR, Estimated: >60 mL/min (ref >60)
Glucose, Bld: 93 mg/dL (ref 70–99)
Potassium: 3.9 mmol/L (ref 3.5–5.1)
Sodium: 137 mmol/L (ref 135–145)
Total Bilirubin: 0.3 mg/dL (ref 0.3–1.2)
Total Protein: 7 g/dL (ref 6.5–8.1)

## 2020-11-16 LAB — DIFFERENTIAL
Abs Immature Granulocytes: 0.02 10*3/uL (ref 0.00–0.07)
Basophils Absolute: 0.1 10*3/uL (ref 0.0–0.1)
Basophils Relative: 1 %
Eosinophils Absolute: 0.1 10*3/uL (ref 0.0–0.5)
Eosinophils Relative: 1 %
Immature Granulocytes: 0 %
Lymphocytes Relative: 46 %
Lymphs Abs: 3.9 10*3/uL (ref 0.7–4.0)
Monocytes Absolute: 0.4 10*3/uL (ref 0.1–1.0)
Monocytes Relative: 4 %
Neutro Abs: 4 10*3/uL (ref 1.7–7.7)
Neutrophils Relative %: 48 %

## 2020-11-16 LAB — TROPONIN I (HIGH SENSITIVITY)
Troponin I (High Sensitivity): 2 ng/L (ref <18)
Troponin I (High Sensitivity): 3 ng/L (ref <18)

## 2020-11-16 LAB — PROTIME-INR
INR: 1 (ref 0.8–1.2)
Prothrombin Time: 13.1 seconds (ref 11.4–15.2)

## 2020-11-16 LAB — RAPID URINE DRUG SCREEN, HOSP PERFORMED
Amphetamines: NOT DETECTED
Barbiturates: NOT DETECTED
Benzodiazepines: NOT DETECTED
Cocaine: NOT DETECTED
Opiates: NOT DETECTED
Tetrahydrocannabinol: POSITIVE — AB

## 2020-11-16 LAB — CBC
HCT: 40 % (ref 36.0–46.0)
Hemoglobin: 12.9 g/dL (ref 12.0–15.0)
MCH: 23.5 pg — ABNORMAL LOW (ref 26.0–34.0)
MCHC: 32.3 g/dL (ref 30.0–36.0)
MCV: 72.7 fL — ABNORMAL LOW (ref 80.0–100.0)
Platelets: 267 10*3/uL (ref 150–400)
RBC: 5.5 MIL/uL — ABNORMAL HIGH (ref 3.87–5.11)
RDW: 13.8 % (ref 11.5–15.5)
WBC: 8.5 10*3/uL (ref 4.0–10.5)
nRBC: 0 % (ref 0.0–0.2)

## 2020-11-16 LAB — URINALYSIS, ROUTINE W REFLEX MICROSCOPIC
Bilirubin Urine: NEGATIVE
Glucose, UA: NEGATIVE mg/dL
Hgb urine dipstick: NEGATIVE
Ketones, ur: NEGATIVE mg/dL
Leukocytes,Ua: NEGATIVE
Nitrite: NEGATIVE
Protein, ur: NEGATIVE mg/dL
Specific Gravity, Urine: <1.005 — ABNORMAL LOW (ref 1.005–1.030)
pH: 5.5 (ref 5.0–8.0)

## 2020-11-16 LAB — RESP PANEL BY RT-PCR (FLU A&B, COVID) ARPGX2
Influenza A by PCR: NEGATIVE
Influenza B by PCR: NEGATIVE
SARS Coronavirus 2 by RT PCR: NEGATIVE

## 2020-11-16 LAB — PREGNANCY, URINE: Preg Test, Ur: NEGATIVE

## 2020-11-16 LAB — APTT: aPTT: 26 seconds (ref 24–36)

## 2020-11-16 MED ORDER — IOHEXOL 350 MG/ML SOLN
100.0000 mL | Freq: Once | INTRAVENOUS | Status: AC | PRN
  Administered 2020-11-16: 100 mL via INTRAVENOUS

## 2020-11-16 NOTE — ED Notes (Signed)
Ambulated to bathroom with steady gait.  Continues to report left arm numbness, denies pain.

## 2020-11-16 NOTE — ED Provider Notes (Signed)
[facility] EMERGENCY DEPARTMENT Provider Note   CSN: [id number] Arrival date & time: 11/16/20  1010     History Chief Complaint  Patient presents with  . left arm numbness    Rozell Kettlewell Alcon is a 40 y.o. female.  HPI     730AM left side went numb after turning to make coffee, did not turn neck EMT evaluated her at [facility] at work but then came POV to [facility] Now feeling it just in arm and chest Sharp pain going from back to chest and left sided numbness Left side was weak at first, now improved No drooping of the face, difficulty talking, change in vision No facial sensation changes   Now having pain in back to the chest, feels like stabbing pain  Fam hx of heart disease (as well as DM, htn, hldp), no fam hx of early stroke, extended fam hx of MS Smoking, etoh, thc  Past Medical History:  Diagnosis Date  . Endometriosis   . Herpes     Patient Active Problem List   Diagnosis Date Noted  . TIA (transient ischemic attack) 11/16/2020    Past Surgical History:  Procedure Laterality Date  . ABDOMINAL HYSTERECTOMY    . CESAREAN SECTION       OB History   No obstetric history on file.     History reviewed. No pertinent family history.  Social History   Tobacco Use  . Smoking status: Current Every Day Smoker    Packs/day: 0.50    Types: Cigarettes  . Smokeless tobacco: Never Used  Substance Use Topics  . Alcohol use: No  . Drug use: Yes    Types: Marijuana    Home Medications Prior to Admission medications   Medication Sig Start Date End Date Taking? Authorizing Provider  gabapentin (NEURONTIN) 300 MG capsule Take 300 mg by mouth 3 (three) times daily.    [provider]  ibuprofen (ADVIL,MOTRIN) 400 MG tablet Take 1 tablet (400 mg total) by mouth every 6 (six) hours as needed. 03/09/15   Mesner, Barbara Cower, MD  methocarbamol (ROBAXIN) 500 MG tablet Take 1 tablet (500 mg total) by mouth 2 (two) times daily. 03/09/15   Mesner, Barbara Cower,  MD  valACYclovir (VALTREX) 500 MG tablet Take 500 mg by mouth 2 (two) times daily.    [provider]    Allergies    Penicillins  Review of Systems   Review of Systems  Constitutional: Negative for fever.  HENT: Negative for sore throat.   Eyes: Negative for visual disturbance.  Respiratory: Negative for cough and shortness of breath.   Cardiovascular: Positive for chest pain.  Gastrointestinal: Negative for abdominal pain, nausea and vomiting.  Genitourinary: Negative for difficulty urinating.  Musculoskeletal: Positive for back pain. Negative for neck pain.  Skin: Negative for rash.  Neurological: Positive for weakness and numbness. Negative for dizziness, syncope, facial asymmetry, speech difficulty and headaches.    Physical Exam Updated Vital Signs BP 137/80 (BP Location: Right Arm)   Pulse 79   Temp 98.3 F (36.8 C) (Oral)   Resp 18   Ht 5\' 3"  (1.6 m)   Wt 90.7 kg   SpO2 100%   BMI 35.43 kg/m   Physical Exam Vitals and nursing note reviewed.  Constitutional:      General: She is not in acute distress.    Appearance: She is well-developed. She is not diaphoretic.  HENT:     Head: Normocephalic and atraumatic.  Eyes:  General: No visual field deficit.    Conjunctiva/sclera: Conjunctivae normal.  Cardiovascular:     Rate and Rhythm: Normal rate and regular rhythm.     Heart sounds: Normal heart sounds. No murmur heard. No friction rub. No gallop.   Pulmonary:     Effort: Pulmonary effort is normal. No respiratory distress.     Breath sounds: Normal breath sounds. No wheezing or rales.  Abdominal:     General: There is no distension.     Palpations: Abdomen is soft.     Tenderness: There is no abdominal tenderness. There is no guarding.  Musculoskeletal:        General: No tenderness.     Cervical back: Normal range of motion.  Skin:    General: Skin is warm and dry.     Findings: No erythema or rash.  Neurological:     Mental Status: She  is alert and oriented to person, place, and time.     GCS: GCS eye subscore is 4. GCS verbal subscore is 5. GCS motor subscore is 6.     Cranial Nerves: Cranial nerves are intact. No cranial nerve deficit, dysarthria or facial asymmetry.     Sensory: Sensory deficit (altered sensation LUE) present.     Motor: Motor function is intact. No weakness, tremor or pronator drift.     Coordination: Coordination is intact.     Gait: Tandem walk normal.     ED Results / Procedures / Treatments   Labs (all labs ordered are listed, but only abnormal results are displayed) Labs Reviewed  ETHANOL - Abnormal; Notable for the following components:      Result Value   Alcohol, Ethyl (B) 68 (*)    All other components within normal limits  CBC - Abnormal; Notable for the following components:   RBC 5.50 (*)    MCV 72.7 (*)    MCH 23.5 (*)    All other components within normal limits  COMPREHENSIVE METABOLIC PANEL - Abnormal; Notable for the following components:   CO2 21 (*)    All other components within normal limits  RAPID URINE DRUG SCREEN, HOSP PERFORMED - Abnormal; Notable for the following components:   Tetrahydrocannabinol POSITIVE (*)    All other components within normal limits  URINALYSIS, ROUTINE W REFLEX MICROSCOPIC - Abnormal; Notable for the following components:   Specific Gravity, Urine <1.005 (*)    All other components within normal limits  RESP PANEL BY RT-PCR (FLU A&B, COVID) ARPGX2  PROTIME-INR  APTT  DIFFERENTIAL  PREGNANCY, URINE  TROPONIN I (HIGH SENSITIVITY)  TROPONIN I (HIGH SENSITIVITY)    EKG None  Radiology CT Head Wo Contrast  Result Date: 11/16/2020 CLINICAL DATA:  Numbness on left side of body. EXAM: CT HEAD WITHOUT CONTRAST TECHNIQUE: Contiguous axial images were obtained from the base of the skull through the vertex without intravenous contrast. COMPARISON:  07/31/2014 FINDINGS: Brain: No evidence of acute infarction, hemorrhage, hydrocephalus,  extra-axial collection or mass lesion/mass effect. Vascular: No hyperdense vessel or unexpected calcification. Skull: Normal. Negative for fracture or focal lesion. Sinuses/Orbits: No acute finding. Other: None. IMPRESSION: No acute intracranial abnormalities. Normal brain.  Head Electronically Signed   By: Signa Kell M.D.   On: 11/16/2020 12:15   CT Angio Chest/Abd/Pel for Dissection W and/or Wo Contrast  Result Date: 11/16/2020 CLINICAL DATA:  Abdominal pain aortic dissection suspected. EXAM: CT ANGIOGRAPHY CHEST, ABDOMEN AND PELVIS TECHNIQUE: Non-contrast CT of the chest was initially obtained. Multidetector CT imaging through  the chest, abdomen and pelvis was performed using the standard protocol during bolus administration of intravenous contrast. Multiplanar reconstructed images and MIPs were obtained and reviewed to evaluate the vascular anatomy. CONTRAST:  OMNIPAQUE IOHEXOL 350 MG/ML SOLN COMPARISON:  None. FINDINGS: CTA CHEST FINDINGS Cardiovascular: Precontrast imaging demonstrate no evidence of intramural hematoma or calcified atherosclerosis. Preferential opacification of the thoracic aorta. No evidence of thoracic aortic aneurysm or dissection. No central pulmonary embolus. Normal heart size. No pericardial effusion. Mediastinum/Nodes: No enlarged mediastinal, hilar, or axillary lymph nodes. Thyroid gland, trachea, and esophagus demonstrate no significant findings. Lungs/Pleura: Lungs are clear. No pleural effusion or pneumothorax. Musculoskeletal: No chest wall abnormality. No acute or significant osseous findings. Review of the MIP images confirms the above findings. CTA ABDOMEN AND PELVIS FINDINGS VASCULAR Aorta: Normal caliber aorta without aneurysm, dissection, vasculitis or significant stenosis. Celiac: Patent without evidence of aneurysm, dissection, vasculitis or significant stenosis. SMA: Patent without evidence of aneurysm, dissection, vasculitis or significant stenosis. Renals:  Both renal arteries are patent without evidence of aneurysm, dissection, vasculitis, fibromuscular dysplasia or significant stenosis. IMA: Patent without evidence of aneurysm, dissection, vasculitis or significant stenosis. Inflow: Patent without evidence of aneurysm, dissection, vasculitis or significant stenosis. Veins: No obvious venous abnormality within the limitations of this arterial phase study. Review of the MIP images confirms the above findings. NON-VASCULAR Hepatobiliary: No focal liver abnormality is seen. No gallstones, gallbladder wall thickening, or biliary dilatation. Pancreas: Unremarkable. No pancreatic ductal dilatation or surrounding inflammatory changes. Spleen: Normal in size without focal abnormality. Adrenals/Urinary Tract: Adrenal glands are unremarkable. Kidneys are normal, without renal calculi, solid enhancing lesion, or hydronephrosis. Bladder is unremarkable. Stomach/Bowel: Stomach is within normal limits. Appendix appears normal. No evidence of bowel wall thickening, distention, or inflammatory changes. Colonic diverticulosis without findings of acute diverticulitis Lymphatic: No pathologically enlarged abdominal or pelvic lymph nodes. Reproductive: Uterus appears surgically absent. Right ovary is unremarkable. Within the left ovary there is thick wall crenulated structure in the left lower ovary may represent a ruptured hemorrhagic cyst or a corpus luteal cyst on image 163/5 and a thin wall low-density left ovarian cyst measuring 2.9 cm on image 156/5. No follow-up imaging recommended. Note: This recommendation does not apply to premenarchal patients and to those with increased risk (genetic, family history, elevated tumor markers or other high-risk factors) of ovarian cancer. Reference: JACR 2020 Feb; 17(2):248-254 Other: Trace pelvic free fluid. Musculoskeletal: No acute or significant osseous findings. Review of the MIP images confirms the above findings. IMPRESSION: 1. No  evidence of acute aortic syndrome. 2. No acute intrathoracic or intra-abdominal pathology. 3. Left ovarian cyst measuring 2.9 cm within an additional thick walled crenulated structure in the lower aspect of the ovary which may represent an involuted ruptured hemorrhagic cyst or a corpus luteal cyst. Additionally there is trace pelvic free fluid which may be physiologic or represent fluid from cyst rupture. Electronically Signed   By: Maudry Mayhew MD   On: 11/16/2020 12:19    Procedures Procedures   Medications Ordered in ED Medications  iohexol (OMNIPAQUE) 350 MG/ML injection 100 mL (100 mLs Intravenous Contrast Given 11/16/20 1116)    ED Course  I have reviewed the triage vital signs and the nursing notes.  Pertinent labs & imaging results that were available during my care of the patient were reviewed by me and considered in my medical decision making (see chart for details).    MDM Rules/Calculators/A&P  40 year old female with a history of smoking presents with concern for left-sided numbness, and back pain radiating to the chest.  Initially discussed calling a possible code stroke-- discussed with Dr. Selina CooleyStack of [facility] but agree that given rapidly improved and improving symptoms with only left arm numbness present on exam, do not feel the risks of tPA outweigh the benefits and code stroke was canceled.  Given her description of stabbing back pain rating to the chest in addition to neurologic symptoms, CT dissection study was ordered which showed no evidence of acute dissection.  It does show signs of a left-sided ovarian cyst and recommend follow-up ultrasound for further evaluation.  Delta troponins negative have low suspicion for ACS.  CT head was completed which shows no acute abnormalities.  Discussed presentation again with Dr. Selina CooleyStack of neurology including her transient left-sided weakness followed by left-sided numbness,  and feel admission for TIA/CVA work-up is most appropriate.  Called for admission, however she declines admission.  [facility] for [facility].  She declines transport and further evaluation.  She understands the risks of death and disability and is leaving AGAINST MEDICAL ADVICE.  Final Clinical Impression(s) / ED Diagnoses Final diagnoses:  Left sided numbness  Stroke-like symptoms  Chest pain, unspecified type  Left ovarian cyst    Rx / DC Orders ED Discharge Orders    None       Alvira MondaySchlossman, Faaris Arizpe, MD 11/16/20 2321

## 2020-11-16 NOTE — ED Notes (Signed)
Dr Dalene Seltzer initially called code stroke, at bedside during teleneuro activation, received call from neurology, code stroke cancelled per Dr Dalene Seltzer, pt to CT,

## 2020-11-16 NOTE — ED Triage Notes (Signed)
Patient reports left side going numb this morning. Receding to waist-up numbness. Now only with shoulder muscle tightness in L arm. No LOC. Transferred to bed from wheelchair without assistance. Holding left arm up/protection hold.

## 2020-11-16 NOTE — ED Notes (Signed)
Patient transported to CT
